# Patient Record
Sex: Male | Born: 1950 | Race: White | Hispanic: No | Marital: Single | Smoking: Former smoker
Health system: Southern US, Community
[De-identification: ages and names within clinical notes are randomized; demographics above are authoritative.]

---

## 2019-03-04 ENCOUNTER — Other Ambulatory Visit: Payer: Self-pay

## 2019-03-04 ENCOUNTER — Inpatient Hospital Stay
Admission: EM | Admit: 2019-03-04 | Discharge: 2019-03-06 | DRG: 246 | Disposition: A | Discharge status: Home / Self Care | Payer: Medicare HMO | Attending: Internal Medicine | Admitting: Internal Medicine

## 2019-03-04 ENCOUNTER — Emergency Department: Payer: Medicare HMO

## 2019-03-04 ENCOUNTER — Encounter
Admission: EM | Disposition: A | Discharge status: Home / Self Care | Payer: Self-pay | Source: Home / Self Care | Attending: Internal Medicine

## 2019-03-04 ENCOUNTER — Encounter: Payer: Self-pay | Admitting: Emergency Medicine

## 2019-03-04 DIAGNOSIS — Z87891 Personal history of nicotine dependence: Secondary | ICD-10-CM

## 2019-03-04 DIAGNOSIS — D72829 Elevated white blood cell count, unspecified: Secondary | ICD-10-CM | POA: Diagnosis present

## 2019-03-04 DIAGNOSIS — R001 Bradycardia, unspecified: Secondary | ICD-10-CM | POA: Diagnosis present

## 2019-03-04 DIAGNOSIS — Z8249 Family history of ischemic heart disease and other diseases of the circulatory system: Secondary | ICD-10-CM | POA: Diagnosis not present

## 2019-03-04 DIAGNOSIS — I213 ST elevation (STEMI) myocardial infarction of unspecified site: Secondary | ICD-10-CM | POA: Diagnosis present

## 2019-03-04 DIAGNOSIS — Z1159 Encounter for screening for other viral diseases: Secondary | ICD-10-CM

## 2019-03-04 DIAGNOSIS — I110 Hypertensive heart disease with heart failure: Secondary | ICD-10-CM | POA: Diagnosis present

## 2019-03-04 DIAGNOSIS — I5021 Acute systolic (congestive) heart failure: Secondary | ICD-10-CM | POA: Diagnosis present

## 2019-03-04 DIAGNOSIS — R079 Chest pain, unspecified: Secondary | ICD-10-CM

## 2019-03-04 DIAGNOSIS — I2119 ST elevation (STEMI) myocardial infarction involving other coronary artery of inferior wall: Secondary | ICD-10-CM | POA: Diagnosis present

## 2019-03-04 DIAGNOSIS — R739 Hyperglycemia, unspecified: Secondary | ICD-10-CM | POA: Diagnosis present

## 2019-03-04 HISTORY — PX: LEFT HEART CATH AND CORONARY ANGIOGRAPHY: CATH118249

## 2019-03-04 HISTORY — PX: CORONARY/GRAFT ACUTE MI REVASCULARIZATION: CATH118305

## 2019-03-04 LAB — CBC
HCT: 50.4 % (ref 39.0–52.0)
Hemoglobin: 16.4 g/dL (ref 13.0–17.0)
MCH: 29.9 pg (ref 26.0–34.0)
MCHC: 32.5 g/dL (ref 30.0–36.0)
MCV: 92 fL (ref 80.0–100.0)
Platelets: 219 10*3/uL (ref 150–400)
RBC: 5.48 MIL/uL (ref 4.22–5.81)
RDW: 13.2 % (ref 11.5–15.5)
WBC: 10.8 10*3/uL — ABNORMAL HIGH (ref 4.0–10.5)
nRBC: 0 % (ref 0.0–0.2)

## 2019-03-04 LAB — BASIC METABOLIC PANEL
Anion gap: 10 (ref 5–15)
BUN: 16 mg/dL (ref 8–23)
CO2: 26 mmol/L (ref 22–32)
Calcium: 8.9 mg/dL (ref 8.9–10.3)
Chloride: 104 mmol/L (ref 98–111)
Creatinine, Ser: 1.11 mg/dL (ref 0.61–1.24)
GFR calc Af Amer: >60 mL/min (ref >60)
GFR calc non Af Amer: >60 mL/min (ref >60)
Glucose, Bld: 132 mg/dL — ABNORMAL HIGH (ref 70–99)
Potassium: 4 mmol/L (ref 3.5–5.1)
Sodium: 140 mmol/L (ref 135–145)

## 2019-03-04 LAB — TROPONIN I
Troponin I: 0.32 ng/mL (ref <0.03)
Troponin I: 6.37 ng/mL (ref <0.03)

## 2019-03-04 LAB — POCT ACTIVATED CLOTTING TIME
Activated Clotting Time: 224 seconds
Activated Clotting Time: 312 seconds

## 2019-03-04 LAB — GLUCOSE, CAPILLARY: Glucose-Capillary: 106 mg/dL — ABNORMAL HIGH (ref 70–99)

## 2019-03-04 LAB — SARS CORONAVIRUS 2 BY RT PCR (HOSPITAL ORDER, PERFORMED IN ~~LOC~~ HOSPITAL LAB): SARS Coronavirus 2: NEGATIVE

## 2019-03-04 SURGERY — CORONARY/GRAFT ACUTE MI REVASCULARIZATION
Anesthesia: Moderate Sedation

## 2019-03-04 MED ORDER — SODIUM CHLORIDE 0.9 % IV SOLN: 250.0000 mL | INTRAVENOUS | Status: DC | PRN

## 2019-03-04 MED ORDER — HEPARIN SODIUM (PORCINE) 1000 UNIT/ML IJ SOLN
INTRAMUSCULAR | Status: AC
  Filled 2019-03-04: qty 1

## 2019-03-04 MED ORDER — SODIUM CHLORIDE 0.9 % WEIGHT BASED INFUSION
1.0000 mL/kg/h | INTRAVENOUS | Status: AC
  Administered 2019-03-04 (×2): 1 mL/kg/h via INTRAVENOUS

## 2019-03-04 MED ORDER — NITROGLYCERIN 1 MG/10 ML FOR IR/CATH LAB
INTRA_ARTERIAL | Status: DC | PRN
  Administered 2019-03-04: 200 ug via INTRACORONARY

## 2019-03-04 MED ORDER — VERAPAMIL HCL 2.5 MG/ML IV SOLN
INTRAVENOUS | Status: DC | PRN
  Administered 2019-03-04: 2.5 mg via INTRAVENOUS

## 2019-03-04 MED ORDER — LISINOPRIL 5 MG PO TABS
5.0000 mg | ORAL_TABLET | Freq: Every day | ORAL | Status: DC
  Administered 2019-03-04 – 2019-03-06 (×3): 5 mg via ORAL
  Filled 2019-03-04 (×3): qty 1

## 2019-03-04 MED ORDER — TICAGRELOR 90 MG PO TABS
90.0000 mg | ORAL_TABLET | Freq: Two times a day (BID) | ORAL | Status: DC
  Administered 2019-03-04 – 2019-03-06 (×4): 90 mg via ORAL
  Filled 2019-03-04 (×4): qty 1

## 2019-03-04 MED ORDER — ASPIRIN 81 MG PO CHEW
81.0000 mg | CHEWABLE_TABLET | Freq: Every day | ORAL | Status: DC
  Administered 2019-03-04 – 2019-03-06 (×3): 81 mg via ORAL
  Filled 2019-03-04 (×3): qty 1

## 2019-03-04 MED ORDER — FENTANYL CITRATE (PF) 100 MCG/2ML IJ SOLN
INTRAMUSCULAR | Status: AC
  Filled 2019-03-04: qty 2

## 2019-03-04 MED ORDER — IOPAMIDOL (ISOVUE-300) INJECTION 61%
INTRAVENOUS | Status: DC | PRN
  Administered 2019-03-04: 14:00:00 220 mL via INTRAVENOUS

## 2019-03-04 MED ORDER — LABETALOL HCL 5 MG/ML IV SOLN: 10.0000 mg | INTRAVENOUS | Status: AC | PRN

## 2019-03-04 MED ORDER — ONDANSETRON HCL 4 MG/2ML IJ SOLN
4.0000 mg | Freq: Four times a day (QID) | INTRAMUSCULAR | Status: DC | PRN
  Administered 2019-03-04: 4 mg via INTRAVENOUS
  Filled 2019-03-04: qty 2

## 2019-03-04 MED ORDER — VERAPAMIL HCL 2.5 MG/ML IV SOLN
INTRAVENOUS | Status: AC
  Filled 2019-03-04: qty 2

## 2019-03-04 MED ORDER — ATORVASTATIN CALCIUM 20 MG PO TABS
80.0000 mg | ORAL_TABLET | Freq: Every day | ORAL | Status: DC
  Administered 2019-03-05: 80 mg via ORAL
  Filled 2019-03-04: qty 4
  Filled 2019-03-04 (×2): qty 1

## 2019-03-04 MED ORDER — MIDAZOLAM HCL 2 MG/2ML IJ SOLN
INTRAMUSCULAR | Status: AC
  Filled 2019-03-04: qty 2

## 2019-03-04 MED ORDER — NITROGLYCERIN 1 MG/10 ML FOR IR/CATH LAB
INTRA_ARTERIAL | Status: AC
  Filled 2019-03-04: qty 10

## 2019-03-04 MED ORDER — ACETAMINOPHEN 325 MG PO TABS: 650.0000 mg | ORAL_TABLET | ORAL | Status: DC | PRN

## 2019-03-04 MED ORDER — SODIUM CHLORIDE 0.9% FLUSH
3.0000 mL | Freq: Two times a day (BID) | INTRAVENOUS | Status: DC
  Administered 2019-03-05 – 2019-03-06 (×4): 3 mL via INTRAVENOUS

## 2019-03-04 MED ORDER — MIDAZOLAM HCL 2 MG/2ML IJ SOLN
INTRAMUSCULAR | Status: DC | PRN
  Administered 2019-03-04: 1 mg via INTRAVENOUS

## 2019-03-04 MED ORDER — HEPARIN SODIUM (PORCINE) 1000 UNIT/ML IJ SOLN
4000.0000 [IU] | Freq: Once | INTRAMUSCULAR | Status: AC
  Administered 2019-03-04: 4000 [IU] via INTRAVENOUS
  Filled 2019-03-04: qty 4

## 2019-03-04 MED ORDER — NITROGLYCERIN 0.4 MG SL SUBL
SUBLINGUAL_TABLET | SUBLINGUAL | Status: AC
  Administered 2019-03-04: 0.4 mg via SUBLINGUAL
  Filled 2019-03-04: qty 1

## 2019-03-04 MED ORDER — HEPARIN SODIUM (PORCINE) 1000 UNIT/ML IJ SOLN
INTRAMUSCULAR | Status: DC | PRN
  Administered 2019-03-04 (×2): 6000 [IU] via INTRAVENOUS

## 2019-03-04 MED ORDER — NITROGLYCERIN 0.4 MG SL SUBL
0.4000 mg | SUBLINGUAL_TABLET | SUBLINGUAL | Status: DC | PRN
  Administered 2019-03-04: 16:00:00 0.4 mg via SUBLINGUAL

## 2019-03-04 MED ORDER — TICAGRELOR 90 MG PO TABS
ORAL_TABLET | ORAL | Status: AC
  Filled 2019-03-04: qty 2

## 2019-03-04 MED ORDER — FENTANYL CITRATE (PF) 100 MCG/2ML IJ SOLN
INTRAMUSCULAR | Status: DC | PRN
  Administered 2019-03-04: 25 ug via INTRAVENOUS

## 2019-03-04 MED ORDER — HYDRALAZINE HCL 20 MG/ML IJ SOLN: 10.0000 mg | INTRAMUSCULAR | Status: AC | PRN

## 2019-03-04 MED ORDER — SODIUM CHLORIDE 0.9% FLUSH: 3.0000 mL | INTRAVENOUS | Status: DC | PRN

## 2019-03-04 MED ORDER — HEPARIN (PORCINE) IN NACL 1000-0.9 UT/500ML-% IV SOLN
INTRAVENOUS | Status: AC
  Filled 2019-03-04: qty 1000

## 2019-03-04 MED ORDER — TICAGRELOR 90 MG PO TABS
ORAL_TABLET | ORAL | Status: DC | PRN
  Administered 2019-03-04: 180 mg via ORAL

## 2019-03-04 MED ORDER — SODIUM CHLORIDE 0.9 % IV SOLN
INTRAVENOUS | Status: AC | PRN
  Administered 2019-03-04: 50 mL/h via INTRAVENOUS

## 2019-03-04 SURGICAL SUPPLY — 14 items
BALLN TREK RX 2.5X20 (BALLOONS) ×3
BALLOON TREK RX 2.5X20 (BALLOONS) ×1 IMPLANT
CATH INFINITI 5 FR JL3.5 (CATHETERS) ×3 IMPLANT
CATH INFINITI 5FR ANG PIGTAIL (CATHETERS) ×3 IMPLANT
CATH LAUNCHER 6FR JR4 (CATHETERS) ×3 IMPLANT
CATH VISTA GUIDE 6FR JL3.5 (CATHETERS) ×3 IMPLANT
DEVICE INFLAT 30 PLUS (MISCELLANEOUS) ×3 IMPLANT
DEVICE RAD TR BAND REGULAR (VASCULAR PRODUCTS) ×3 IMPLANT
GLIDESHEATH SLEND SS 6F .021 (SHEATH) ×3 IMPLANT
KIT MANI 3VAL PERCEP (MISCELLANEOUS) ×3 IMPLANT
PACK CARDIAC CATH (CUSTOM PROCEDURE TRAY) ×3 IMPLANT
STENT SYNERGY DES 3X24 (Permanent Stent) ×3 IMPLANT
WIRE ASAHI PROWATER 180CM (WIRE) ×3 IMPLANT
WIRE ROSEN-J .035X260CM (WIRE) ×3 IMPLANT

## 2019-03-04 NOTE — Progress Notes (Signed)
   03/04/19 1300  Clinical Encounter Type  Visited With Patient  Visit Type Initial;Spiritual support;Other (Comment)  Referral From Nurse  Consult/Referral To Nurse  Spiritual Encounters  Spiritual Needs Emotional  Chaplain received page for Code Stemi and arrived at patient room and patient was lying in bed. Chaplain introduce herself and ask the patient was he feeling better. Patient told Chaplain "they gave me four baby Asprin and my pain was at 9 but now it is at 1". Chaplain said she was glad patient was feeling better. Chaplain ask was there anything she could do for him at this time and patient said no thank you. Chaplain told patient if he needed anything to have her page.

## 2019-03-04 NOTE — ED Provider Notes (Signed)
[facility] Emergency Department Provider Note  Time seen: 12:45 PM  I have reviewed the triage vital signs and the nursing notes.   HISTORY  Chief Complaint Chest Pain   HPI Justin Smith is a 68 y.o. male with no past medical history, takes no medications, presents to the emergency department for chest pain.  According to the patient has had moderate intermittent chest pain in the central chest over the past 2 to 3 days.  States it comes and goes but never last more than a few minutes.  Today several hours ago he states that chest pain started and has been fairly constant now with left arm radiation.  States he became very diaphoretic, denies any shortness of breath or nausea.  No fever cough congestion.  Patient denies any prior cardiac issues.  No prior cardiac catheterizations.   History reviewed. No pertinent past medical history.  There are no active problems to display for this patient.   History reviewed. No pertinent surgical history.  Prior to Admission medications   Not on File    No Known Allergies  History reviewed. No pertinent family history.  Social History Social History   Tobacco Use  . Smoking status: Former Games developermoker  . Smokeless tobacco: Never Used  Substance Use Topics  . Alcohol use: Not Currently  . Drug use: Never    Review of Systems Constitutional: Negative for fever. Cardiovascular: Positive for chest pain with left arm radiation. Respiratory: Negative for shortness of breath.  Negative for cough. Gastrointestinal: Negative for abdominal pain, vomiting.  Negative for nausea. Musculoskeletal: Negative for musculoskeletal complaints Skin: States diaphoresis earlier, none currently. Neurological: Negative for headache All other ROS negative  ____________________________________________   PHYSICAL EXAM:  Constitutional: Alert and oriented. Well appearing and in no distress. Eyes: Normal exam ENT      Head:  Normocephalic and atraumatic      Mouth/Throat: Mucous membranes are moist. Cardiovascular: Normal rate, regular rhythm. Respiratory: Normal respiratory effort without tachypnea nor retractions. Breath sounds are clear  Gastrointestinal: Soft and nontender. No distention. Musculoskeletal: Nontender with normal range of motion in all extremities. No lower extremity tenderness or edema. Neurologic:  Normal speech and language. No gross focal neurologic deficits  Skin:  Skin is warm, dry and intact.  Psychiatric: Mood and affect are normal.   ____________________________________________    EKG  EKG viewed and interpreted by myself shows a normal sinus rhythm at 61 bpm.  Narrow QRS, normal axis.  Normal intervals.  Patient does have mild ST elevation in 2, 3, aVF with mild depression in V2.  Consistent with possible inferior or posterior STEMI.  Repeat EKG 21: 41: 24 viewed and interpreted by myself shows a normal sinus rhythm at 62 bpm with a narrow QRS, normal axis, normal intervals, mild ST elevation in leads II, III and aVF with slight ST depression in V2.  ____________________________________________    RADIOLOGY  Chest x-ray shows cardiomegaly.  ____________________________________________   INITIAL IMPRESSION / ASSESSMENT AND PLAN / ED COURSE  Pertinent labs & imaging results that were available during my care of the patient were reviewed by me and considered in my medical decision making (see chart for details).   Patient presents emergency department for intermittent chest pain over the past 2 to 3 days, now a sudden worsening constant chest pain with left arm radiation and diaphoresis at home.  Differential would include ACS, chest wall pain, gastric pain.  Patient's EKG is most consistent with an inferior  Vs posterior STEMI.  Patient received 324 mg aspirin by EMS.  We will dose 4000 units of heparin.  We will discuss with cardiology for a code STEMI.  Patient was taken  emergently to the cardiac catheterization lab for STEMI.  Labs resulted after the patient left the emergency department showed a troponin of 0.32.  Justin Smith was evaluated in Emergency Department on 03/04/2019 for the symptoms described in the history of present illness. He was evaluated in the context of the global COVID-19 pandemic, which necessitated consideration that the patient might be at risk for infection with the SARS-CoV-2 virus that causes COVID-19. Institutional protocols and algorithms that pertain to the evaluation of patients at risk for COVID-19 are in a state of rapid change based on information released by regulatory bodies including the CDC and federal and state organizations. These policies and algorithms were followed during the patient's care in the ED. CRITICAL CARE Performed by: Minna Antis   Total critical care time: 30 minutes  Critical care time was exclusive of separately billable procedures and treating other patients.  Critical care was necessary to treat or prevent imminent or life-threatening deterioration.  Critical care was time spent personally by me on the following activities: development of treatment plan with patient and/or surrogate as well as nursing, discussions with consultants, evaluation of patient's response to treatment, examination of patient, obtaining history from patient or surrogate, ordering and performing treatments and interventions, ordering and review of laboratory studies, ordering and review of radiographic studies, pulse oximetry and re-evaluation of patient's condition.   ____________________________________________   FINAL CLINICAL IMPRESSION(S) / ED DIAGNOSES  Chest pain STEMI   Minna Antis, MD 03/04/19 1531

## 2019-03-04 NOTE — ED Notes (Signed)
Second EKG read and signed by MD Paduchowski at 1241.

## 2019-03-04 NOTE — ED Triage Notes (Signed)
Pt presents to ED via [facility] from home c/o mid-chest pain/pressure starting 3 days ago and worsening today. Pt states feels like something is sitting on his chest. Denies any medical history, denies allergies. Given 324mg  ASA by EMS PTA.

## 2019-03-04 NOTE — ED Notes (Signed)
Pt placed on pads on arrival for EKG reading acute MI.

## 2019-03-04 NOTE — ED Notes (Signed)
Pt reports complete relief of pain at this time

## 2019-03-04 NOTE — Progress Notes (Signed)
Family Meeting Note  Advance Directive:yes  Today a meeting took place with the Patient.  Patient is able to participate.  The following clinical team members were present during this meeting:MD  The following were discussed:Patient's diagnosis: acute STEMI, Patient's progosis: Unable to determine and Goals for treatment: Full Code  Additional follow-up to be provided: prn  Time spent during discussion:20 minutes  Hilton Sinclair, MD

## 2019-03-04 NOTE — ED Notes (Signed)
Pt girlfriend: Verlee Monte [phone]

## 2019-03-04 NOTE — H&P (Signed)
[employer] [facility] -  at [facility]   PATIENT NAME: Justin SequinDavid Smith    MR#:  [id number]  DATE OF BIRTH:  1951-01-04  DATE OF ADMISSION:  03/04/2019  PRIMARY CARE PHYSICIAN: No primary care provider on file.   REQUESTING/REFERRING PHYSICIAN: Minna AntisKevin Paduchowski, MD  CHIEF COMPLAINT:   Chief Complaint  Patient presents with  . Code STEMI    HISTORY OF PRESENT ILLNESS:  Justin Smith  is a 68 y.o. male with no PMH who presented to the ED with substernal chest pain for the last 3 to 4 days.  The chest pain was worse with exertion.  He also had associated shortness of breath and diaphoresis.  He did not have any nausea.  The chest pain would resolve with rest.  This morning, he woke up to severe chest pain that did not resolve with rest, so he came into the ED.  On arrival to the ED, code STEMI was called.  Patient was taken emergently to the Cath Lab.  He was found to have an inferolateral STEMI.  Stent was placed and patient was admitted to the ICU.  PAST MEDICAL HISTORY:  None  PAST SURGICAL HISTORY:  None  SOCIAL HISTORY:   Social History   Tobacco Use  . Smoking status: Former Games developermoker  . Smokeless tobacco: Never Used  Substance Use Topics  . Alcohol use: Not Currently    FAMILY HISTORY:  Father-heart disease  DRUG ALLERGIES:  No Known Allergies  REVIEW OF SYSTEMS:   Review of Systems  Constitutional: Positive for diaphoresis. Negative for chills and fever.  HENT: Negative for congestion and sore throat.   Eyes: Negative for blurred vision.  Respiratory: Positive for shortness of breath. Negative for cough.   Cardiovascular: Positive for chest pain. Negative for palpitations and leg swelling.  Gastrointestinal: Positive for nausea. Negative for abdominal pain and vomiting.  Genitourinary: Negative for dysuria and urgency.  Musculoskeletal: Negative for back pain and neck pain.  Neurological: Negative for dizziness and headaches.   Psychiatric/Behavioral: Negative for depression. The patient is not nervous/anxious.     MEDICATIONS AT HOME:   Prior to Admission medications   Not on File      VITAL SIGNS:  Blood pressure (!) 156/98, pulse (!) 59, temperature 98.2 F (36.8 C), temperature source Oral, resp. rate 15, height 5\' 10"  (1.778 m), weight 122.5 kg, SpO2 100 %.  PHYSICAL EXAMINATION:  Physical Exam  GENERAL:  68 y.o.-year-old patient lying in the bed with no acute distress.  EYES: Pupils equal, round, reactive to light and accommodation. No scleral icterus. Extraocular muscles intact.  HEENT: Head atraumatic, normocephalic. Oropharynx and nasopharynx clear.  NECK:  Supple, no jugular venous distention. No thyroid enlargement, no tenderness.  LUNGS: Normal breath sounds bilaterally, no wheezing, rales,rhonchi or crepitation. No use of accessory muscles of respiration.  CARDIOVASCULAR: RRR, S1, S2 normal. No murmurs, rubs, or gallops.  ABDOMEN: Soft, nontender, nondistended. Bowel sounds present. No organomegaly or mass.  EXTREMITIES: No pedal edema, cyanosis, or clubbing. +Bandage over cath site NEUROLOGIC: Cranial nerves II through XII are intact. Muscle strength 5/5 in all extremities. Sensation intact. Gait not checked.  PSYCHIATRIC: The patient is alert and oriented x 3.  SKIN: No obvious rash, lesion, or ulcer.   LABORATORY PANEL:   CBC Recent Labs  Lab 03/04/19 1245  WBC 10.8*  HGB 16.4  HCT 50.4  PLT 219   ------------------------------------------------------------------------------------------------------------------  Chemistries  Recent Labs  Lab 03/04/19 1245  NA 140  K 4.0  CL 104  CO2 26  GLUCOSE 132*  BUN 16  CREATININE 1.11  CALCIUM 8.9   ------------------------------------------------------------------------------------------------------------------  Cardiac Enzymes Recent Labs  Lab 03/04/19 1245  TROPONINI 0.32*    ------------------------------------------------------------------------------------------------------------------  RADIOLOGY:  Dg Chest Portable 1 View  Result Date: 03/04/2019 CLINICAL DATA:  Chest pain EXAM: PORTABLE CHEST 1 VIEW COMPARISON:  None. FINDINGS: Cardiomegaly. Both lungs are clear. The visualized skeletal structures are unremarkable. IMPRESSION: Cardiomegaly without acute abnormality of the lungs in AP portable projection. Electronically Signed   By: Lauralyn Primes M.D.   On: 03/04/2019 13:02      IMPRESSION AND PLAN:   Acute inferolateral STEMI- code STEMI called on arrival to the ED. Troponin 0.32 and EKG with ST elevations in the inferior leads. -S/p emergent cath with stent x 1 -Start aspirin, brilinta, lipitor, lisinopril -Check A1c and lipid panel [facility] following  New diagnosis of systolic congestive heart failure- EF 40% noted during cath. -Continue lisinopril -Add beta blocker as able  Leukocytosis- likely due to above. No signs of infection. -Recheck WBC in the morning  Hypertension -Start lisinopril -Hydralazine and labetalol prn  Hyperglycemia- no diagnosis of diabetes -Check A1c  All the records are reviewed and case discussed with ED provider. Management plans discussed with the patient, family and they are in agreement.  CODE STATUS: Full  TOTAL TIME TAKING CARE OF THIS PATIENT: 45 minutes.    Jinny Blossom Mayo M.D on 03/04/2019 at 2:08 PM  Between 7am to 6pm - Pager [phone]  After 6pm go to [email or web address] - Scientist, research (life sciences) [place] [facility]  Office  [phone]  CC: Primary care physician; No primary care provider on file.   Note: This dictation was prepared with Dragon dictation along with smaller phrase technology. Any transcriptional errors that result from this process are unintentional.

## 2019-03-05 ENCOUNTER — Inpatient Hospital Stay
Admit: 2019-03-05 | Discharge: 2019-03-05 | Disposition: A | Discharge status: Home / Self Care | Payer: Medicare HMO | Attending: Cardiology | Admitting: Cardiology

## 2019-03-05 ENCOUNTER — Encounter: Payer: Self-pay | Admitting: Cardiology

## 2019-03-05 LAB — CBC
HCT: 44.7 % (ref 39.0–52.0)
Hemoglobin: 14.8 g/dL (ref 13.0–17.0)
MCH: 30 pg (ref 26.0–34.0)
MCHC: 33.1 g/dL (ref 30.0–36.0)
MCV: 90.5 fL (ref 80.0–100.0)
Platelets: 199 10*3/uL (ref 150–400)
RBC: 4.94 MIL/uL (ref 4.22–5.81)
RDW: 13.3 % (ref 11.5–15.5)
WBC: 13.1 10*3/uL — ABNORMAL HIGH (ref 4.0–10.5)
nRBC: 0 % (ref 0.0–0.2)

## 2019-03-05 LAB — BASIC METABOLIC PANEL
Anion gap: 10 (ref 5–15)
BUN: 14 mg/dL (ref 8–23)
CO2: 24 mmol/L (ref 22–32)
Calcium: 8.4 mg/dL — ABNORMAL LOW (ref 8.9–10.3)
Chloride: 106 mmol/L (ref 98–111)
Creatinine, Ser: 0.97 mg/dL (ref 0.61–1.24)
GFR calc Af Amer: >60 mL/min (ref >60)
GFR calc non Af Amer: >60 mL/min (ref >60)
Glucose, Bld: 112 mg/dL — ABNORMAL HIGH (ref 70–99)
Potassium: 4 mmol/L (ref 3.5–5.1)
Sodium: 140 mmol/L (ref 135–145)

## 2019-03-05 LAB — ECHOCARDIOGRAM COMPLETE
Height: 70 in
Weight: 4370.4 oz

## 2019-03-05 LAB — LIPID PANEL
Cholesterol: 175 mg/dL (ref 0–200)
HDL: 31 mg/dL — ABNORMAL LOW (ref >40)
LDL Cholesterol: 126 mg/dL — ABNORMAL HIGH (ref 0–99)
Total CHOL/HDL Ratio: 5.6 RATIO
Triglycerides: 90 mg/dL (ref <150)
VLDL: 18 mg/dL (ref 0–40)

## 2019-03-05 LAB — HEMOGLOBIN A1C
Hgb A1c MFr Bld: 6 % — ABNORMAL HIGH (ref 4.8–5.6)
Mean Plasma Glucose: 125.5 mg/dL

## 2019-03-05 LAB — TROPONIN I
Troponin I: 16.14 ng/mL (ref <0.03)
Troponin I: 35.02 ng/mL (ref <0.03)

## 2019-03-05 NOTE — Progress Notes (Addendum)
Cardiac Rehab Navigator/ Exercise Physiologist Note  "Heart Attack Bouncing Back" booklet given and reviewed with patient. This EP discussed the definition of CAD. This EP reviewed the location of CAD and where his stent was placed. Informed patient he  will be given a stent card. Explained the purpose of the stent card. Instructed patient to keep stent card in his wallet.  ? This EP discussed modifiable risk factors including controlling blood pressure, cholesterol, and blood sugar; following heart healthy diet; maintaining healthy weight; exercise; and smoking cessation, not applicable. Patient will buy a blood pressure cuff so he can check it at home. ? This EP discussed cardiac medications including rationale for taking, mechanisms of action, and side effects. Stressed the importance of taking medications as prescribed.  ? This EP discussed emergency plan for heart attack symptoms. Patient verbalized understanding of need to call 911 and not to drive himself to ER if having cardiac symptoms / chest pain.   Diet of low sodium, low fat, low cholesterol heart healthy diet discussed. Information on diet provided. This EP discussed with patient that he should make small changes as he can instead of changing everything all at once and that he would be more likely to stick to that type of change.  Stress- Patient has high stress. Patient cares for girlfriend who had stroke which occupies a lot of his time and also works. Patient reports no depression. Patient likes to garden and work on his truck.  Smoking Cessation - Patient is a FORMER smoker.  Exercise - Benefits of exercised discussed. Patient likes to swim laps at the [facility] and walk outside. Patient reports that he has not been able to do much at all in the past month because of low energy. This EP encouraged patient that [facility] but would need to start slowly with walking first. Informed patient his cardiologist  has referred him to outpatient Cardiac Rehab. An overview of the program was provided. Brochure, informational letter, class and orientation times, and CPT billing codes given to patient. Patient is interested in participating. Patient plans to check with his insurance company to see what his out-of-pocket expenses will be.   Patient informed the Cardiac Rehab department is currently closed due to the COVID-19 pandemic.  The Cardiac Rehab dept will contact patient as soon as the department reopens. Patient is interested in [employer] as well.  Patient appreciative of the information.    Nelson Chimes, BS [place]  [facility]iac [facility] Pulmonary Re[facility]  Exercise Physiologist Department Phone #: [phone] Fax: [phone]  Direct Line [phone] Email Address: [email or web address]

## 2019-03-05 NOTE — TOC Transition Note (Signed)
Transition of Care [facility]) - CM/SW Discharge Note   Patient Details  Name: Cali Golson MRN: [id number] Date of Birth: [birth year]  [facility]) CM/SW Contact:  Eber Hong, RN Phone Number: 03/05/2019, 2:46 PM   Clinical Narrative:     Low risk score but at time of chart review- there was no insurance listed.  Patient does have medicare humana and says he also has medicaid and medicare part D. He does not have any of his insurance cards with him. Admitted with stemi.  To discharge home on Brilinta.  Will need 30 day trial coupons. Requested patient to have his girlfriend to bring insurance cards.  He is followed by [facility] on [address] and uses their pharmacy.  CM discussed that if discharges over the weekend, that pharmacy would not be open. He asked if hospital could provide him with "a few pills." CM informed him his script could be taken to to a drug store.  He agrees and says his girlfriend will decide which one "because I do not know about all that."He does not recall the name of his pcp.         Patient Goals and CMS Choice        Discharge Placement                       Discharge Plan and Services                                     Social Determinants of Health (SDOH) Interventions     Readmission Risk Interventions No flowsheet data found.

## 2019-03-05 NOTE — Progress Notes (Signed)
[facility] - [place] at [facility]   PATIENT NAME: Justin Smith    MR#:  [id number]  DATE OF BIRTH:  18-Mar-1951  SUBJECTIVE:  Patient without CP overnight  REVIEW OF SYSTEMS:    Review of Systems  Constitutional: Negative for fever, chills weight loss HENT: Negative for ear pain, nosebleeds, congestion, facial swelling, rhinorrhea, neck pain, neck stiffness and ear discharge.   Respiratory: Negative for cough, shortness of breath, wheezing  Cardiovascular: Negative for chest pain, palpitations and leg swelling.  Gastrointestinal: Negative for heartburn, abdominal pain, vomiting, diarrhea or consitpation Genitourinary: Negative for dysuria, urgency, frequency, hematuria Musculoskeletal: Negative for back pain or joint pain Neurological: Negative for dizziness, seizures, syncope, focal weakness,  numbness and headaches.  Hematological: Does not bruise/bleed easily.  Psychiatric/Behavioral: Negative for hallucinations, confusion, dysphoric mood    Tolerating Diet:yes      DRUG ALLERGIES:  No Known Allergies  VITALS:  Blood pressure 100/71, pulse 60, temperature 98.6 F (37 C), resp. rate 14, height 5\' 10"  (1.778 m), weight 123.9 kg, SpO2 99 %.  PHYSICAL EXAMINATION:  Constitutional: Appears well-developed and well-nourished. No distress. HENT: Normocephalic. Marland Kitchen. Oropharynx is clear and moist.  Eyes: Conjunctivae and EOM are normal. PERRLA, no scleral icterus.  Neck: Normal ROM. Neck supple. No JVD. No tracheal deviation. CVS: RRR, S1/S2 +, no murmurs, no gallops, no carotid bruit.  Pulmonary: Effort and breath sounds normal, no stridor, rhonchi, wheezes, rales.  Abdominal: Soft. BS +,  no distension, tenderness, rebound or guarding.  Musculoskeletal: Normal range of motion. No edema and no tenderness.  Neuro: Alert. CN 2-12 grossly intact. No focal deficits. Skin: Skin is warm and dry. No rash noted. Psychiatric: Normal mood and affect.       LABORATORY PANEL:   CBC Recent Labs  Lab 03/05/19 0634  WBC 13.1*  HGB 14.8  HCT 44.7  PLT 199   ------------------------------------------------------------------------------------------------------------------  Chemistries  Recent Labs  Lab 03/05/19 0634  NA 140  K 4.0  CL 106  CO2 24  GLUCOSE 112*  BUN 14  CREATININE 0.97  CALCIUM 8.4*   ------------------------------------------------------------------------------------------------------------------  Cardiac Enzymes Recent Labs  Lab 03/04/19 1836 03/05/19 0057 03/05/19 0634  TROPONINI 6.37* 16.14* 35.02*   ------------------------------------------------------------------------------------------------------------------  RADIOLOGY:  Dg Chest Portable 1 View  Result Date: 03/04/2019 CLINICAL DATA:  Chest pain EXAM: PORTABLE CHEST 1 VIEW COMPARISON:  None. FINDINGS: Cardiomegaly. Both lungs are clear. The visualized skeletal structures are unremarkable. IMPRESSION: Cardiomegaly without acute abnormality of the lungs in AP portable projection. Electronically Signed   By: Lauralyn PrimesAlex  Bibbey M.D.   On: 03/04/2019 13:02     ASSESSMENT AND PLAN:   68 year old male with no past medical history presented to the emergency room due to substernal chest pain.  1.  Inferolateral STEMI: Patient is status post PCI with drug-eluting stent to OM1 Ejection fraction is noted to be 40% on cath. Echocardiogram pending. Continue aspirin, atorvastatin, lisinopril and Brilinta. Heart rate too low to add beta-blocker LDL 126 Outpatient cardiac rehab at discharge  2.  Acute systolic heart failure ejection fraction 40% on cardiac catheterization: Follow-up on echocardiogram Continue ACE inhibitor Beta-blocker if heart rate tolerates  3.  Hyperglycemia: A1c pending     Management plans discussed with the patient and he is in agreement.  CODE STATUS: Full  TOTAL TIME TAKING CARE OF THIS PATIENT: 30 minutes.      POSSIBLE D/C tomorrow, DEPENDING ON CLINICAL CONDITION.   Adrian SaranSital Decorian Schuenemann M.D on 03/05/2019 at 11:02 AM  Between 7am to 6pm - Pager - [phone] After 6pm go to [email or web address] - Social research officer, government  [facility] [place] [facility]  Office  [phone]  CC: Primary care physician; No primary care provider on file.  Note: This dictation was prepared with Dragon dictation along with smaller phrase technology. Any transcriptional errors that result from this process are unintentional.

## 2019-03-05 NOTE — Progress Notes (Signed)
*  PRELIMINARY RESULTS* Echocardiogram 2D Echocardiogram has been performed.  Cristela Blue 03/05/2019, 12:08 PM

## 2019-03-05 NOTE — Consult Note (Signed)
[facility]  CARDIOLOGY CONSULT NOTE  Patient ID: Justin SequinDavid Smith MRN: [id number] DOB/AGE: 03-04-1951 68 y.o.  Admit date: 03/04/2019 Referring Physician [facility] Primary Physician  Primary Cardiologist France Noyce Reason for Consultation inferolateral STEMI  HPI: [id number] old gentleman who [facility] room with 2 to 3-day history of intermittent chest pain.  In the emergency room, the patient complained of 8/10 chest pain.  ECG revealed ST elevations in inferior lateral leads.  The patient was taken emergently to cardiac catheterization where coronary angiography revealed 99% stenosis ostium of a large obtuse marginal branch.  The patient underwent primary PCI with DES OM1.  He was returned to the ICU where he had an uncomplicated hospital course.  Then for MI with a troponin of 0.32, 0.37, 16.14, and 35.02.  On the morning of/29/2020, patient denied chest pain  Review of systems complete and found to be negative unless listed above     History reviewed. No pertinent past medical history.  Past Surgical History:  Procedure Laterality Date  . CORONARY/GRAFT ACUTE MI REVASCULARIZATION N/A 03/04/2019   Procedure: Coronary/Graft Acute MI Revascularization;  Surgeon: Marcina MillardParaschos, Shanyia Stines, MD;  Location: [facility] INVASIVE CV LAB;  Service: Cardiovascular;  Laterality: N/A;  . LEFT HEART CATH AND CORONARY ANGIOGRAPHY N/A 03/04/2019   Procedure: LEFT HEART CATH AND CORONARY ANGIOGRAPHY;  Surgeon: Marcina MillardParaschos, Ruffin Lada, MD;  Location: [facility] INVASIVE CV LAB;  Service: Cardiovascular;  Laterality: N/A;    Medications Prior to Admission  Medication Sig Dispense Refill Last Dose  . Multiple Vitamin (MULTIVITAMIN WITH MINERALS) TABS tablet Take 1 tablet by mouth daily.      Social History   Socioeconomic History  . Marital status: Not on file    Spouse name: Not on file  . Number of children: Not on file  . Years of education: Not on file  . Highest education level: Not on file  Occupational  History  . Not on file  Social Needs  . Financial resource strain: Not on file  . Food insecurity:    Worry: Not on file    Inability: Not on file  . Transportation needs:    Medical: Not on file    Non-medical: Not on file  Tobacco Use  . Smoking status: Former Games developermoker  . Smokeless tobacco: Never Used  Substance and Sexual Activity  . Alcohol use: Not Currently  . Drug use: Never  . Sexual activity: Not on file  Lifestyle  . Physical activity:    Days per week: Not on file    Minutes per session: Not on file  . Stress: Not on file  Relationships  . Social connections:    Talks on phone: Not on file    Gets together: Not on file    Attends religious service: Not on file    Active member of club or organization: Not on file    Attends meetings of clubs or organizations: Not on file    Relationship status: Not on file  . Intimate partner violence:    Fear of current or ex partner: Not on file    Emotionally abused: Not on file    Physically abused: Not on file    Forced sexual activity: Not on file  Other Topics Concern  . Not on file  Social History Narrative  . Not on file    History reviewed. No pertinent family history.    Review of systems complete and found to be negative unless listed above      PHYSICAL  EXAM  General: Well developed, well nourished, in no acute distress HEENT:  Normocephalic and atramatic Neck:  No JVD.  Lungs: Clear bilaterally to auscultation and percussion. Heart: HRRR . Normal S1 and S2 without gallops or murmurs.  Abdomen: Bowel sounds are positive, abdomen soft and non-tender  Msk:  Back normal, normal gait. Normal strength and tone for age. Extremities: No clubbing, cyanosis or edema.   Neuro: Alert and oriented X 3. Psych:  Good affect, responds appropriately  Labs:   Lab Results  Component Value Date   WBC 13.1 (H) 03/05/2019   HGB 14.8 03/05/2019   HCT 44.7 03/05/2019   MCV 90.5 03/05/2019   PLT 199 03/05/2019     Recent Labs  Lab 03/05/19 0634  NA 140  K 4.0  CL 106  CO2 24  BUN 14  CREATININE 0.97  CALCIUM 8.4*  GLUCOSE 112*   Lab Results  Component Value Date   TROPONINI 35.02 (HH) 03/05/2019    Lab Results  Component Value Date   CHOL 175 03/05/2019   Lab Results  Component Value Date   HDL 31 (L) 03/05/2019   Lab Results  Component Value Date   LDLCALC 126 (H) 03/05/2019   Lab Results  Component Value Date   TRIG 90 03/05/2019   Lab Results  Component Value Date   CHOLHDL 5.6 03/05/2019   No results found for: LDLDIRECT    Radiology: Dg Chest Portable 1 View  Result Date: 03/04/2019 CLINICAL DATA:  Chest pain EXAM: PORTABLE CHEST 1 VIEW COMPARISON:  None. FINDINGS: Cardiomegaly. Both lungs are clear. The visualized skeletal structures are unremarkable. IMPRESSION: Cardiomegaly without acute abnormality of the lungs in AP portable projection. Electronically Signed   By: Lauralyn Primes M.D.   On: 03/04/2019 13:02    EKG: Sinus rhythm with inferolateral STT wave abnormalits  ASSESSMENT AND PLAN:    1.  Inferolateral ST elevation myocardial infarction, status post primary PCI with DES OM1 2.  Baseline sinus bradycardia  Recommendations  1.  Agree with current therapy 2.  Continue dual antiplatelet therapy uninterrupted for 1 year 3.  Continue lisinopril 4.  Hold beta-blocker for now with baseline bradycardia 5.  Review 2D echocardiogram 6.  Transfer to to a telemetry 7.  Plan for discharge 03/05/2019 in a.m. 8.  Follow up as outpatient in 1 week  Signed: Marcina Millard MD,PhD, [facility] 03/05/2019, 8:08 AM

## 2019-03-06 MED ORDER — TICAGRELOR 90 MG PO TABS: 90.0000 mg | ORAL_TABLET | Freq: Two times a day (BID) | ORAL | 0 refills | Status: AC

## 2019-03-06 MED ORDER — ASPIRIN 81 MG PO CHEW: 81.0000 mg | CHEWABLE_TABLET | Freq: Every day | ORAL | 0 refills | Status: AC

## 2019-03-06 MED ORDER — LISINOPRIL 5 MG PO TABS: 5.0000 mg | ORAL_TABLET | Freq: Every day | ORAL | 0 refills | Status: AC

## 2019-03-06 MED ORDER — ATORVASTATIN CALCIUM 80 MG PO TABS: 80.0000 mg | ORAL_TABLET | Freq: Every day | ORAL | 0 refills | Status: AC

## 2019-03-06 MED ORDER — CARVEDILOL 3.125 MG PO TABS: 3.1250 mg | ORAL_TABLET | Freq: Two times a day (BID) | ORAL | 0 refills | Status: AC

## 2019-03-06 MED ORDER — CARVEDILOL 3.125 MG PO TABS: 3.1250 mg | ORAL_TABLET | Freq: Two times a day (BID) | ORAL | Status: DC

## 2019-03-06 MED ORDER — NITROGLYCERIN 0.4 MG SL SUBL: 0.4000 mg | SUBLINGUAL_TABLET | SUBLINGUAL | 12 refills | Status: AC | PRN

## 2019-03-06 NOTE — Discharge Instructions (Signed)
Chest Wall Pain Chest wall pain is pain in or around the bones and muscles of your chest. Chest wall pain may be caused by:  An injury.  Coughing a lot.  Using your chest and arm muscles too much. Sometimes, the cause may not be known. This pain may take a few weeks or longer to get better. Follow these instructions at home: Managing pain, stiffness, and swelling If told, put ice on the painful area:  Put ice in a plastic bag.  Place a towel between your skin and the bag.  Leave the ice on for 20 minutes, 2-3 times a day.  Activity  Rest as told by your doctor.  Avoid doing things that cause pain. This includes lifting heavy items.  Ask your doctor what activities are safe for you. General instructions   Take over-the-counter and prescription medicines only as told by your doctor.  Do not use any products that contain nicotine or tobacco, such as cigarettes, e-cigarettes, and chewing tobacco. If you need help quitting, ask your doctor.  Keep all follow-up visits as told by your doctor. This is important. Contact a doctor if:  You have a fever.  Your chest pain gets worse.  You have new symptoms. Get help right away if:  You feel sick to your stomach (nauseous) or you throw up (vomit).  You feel sweaty or light-headed.  You have a cough with mucus from your lungs (sputum) or you cough up blood.  You are short of breath. These symptoms may be an emergency. Do not wait to see if the symptoms will go away. Get medical help right away. Call your local emergency services (911 in the [place] Do not drive yourself to the hospital. Summary  Chest wall pain is pain in or around the bones and muscles of your chest.  It may be treated with ice, rest, and medicines. Your condition may also get better if you avoid doing things that cause pain.  Contact a doctor if you have a fever, chest pain that gets worse, or new symptoms.  Get help right away if you feel light-headed  or you get short of breath. These symptoms may be an emergency. This information is not intended to replace advice given to you by your health care provider. Make sure you discuss any questions you have with your health care provider. Document Released: 03/11/2008 Document Revised: 03/26/2018 Document Reviewed: 03/26/2018 Elsevier Interactive Patient Education  2019 [employer]   Acute Coronary Syndrome  Acute coronary syndrome (ACS) is a serious problem in which there is suddenly not enough blood and oxygen reaching the heart. ACS can result in chest pain or a heart attack. This condition is a medical emergency. If you have any symptoms of this condition, get help right away. What are the causes? This condition may be caused by:  Buildup of fat and cholesterol inside of the arteries (atherosclerosis). This is the most common cause. The buildup (plaque) can cause blood vessels in the heart (coronary arteries) to become narrow or blocked, which reduces blood flow to the heart. Plaque can also break off and lead to a clot, which can block an artery and cause a heart attack or stroke.  Sudden tightening of the muscles around the coronary arteries (coronary spasm).  Tearing of a coronary artery (spontaneous coronary artery dissection).  Very low blood pressure (hypotension).  An abnormal heartbeat (arrhythmia).  Other medical conditions that cause a decrease of oxygen to the heart, such as anemiaorrespiratory failure.  Using cocaine or methamphetamine. What increases the risk? The following factors may make you more likely to develop this condition:  Age. The risk for ACS increases as you get older.  History of chest pain, heart attack, peripheral artery disease, or stroke.  Having taken chemotherapy or immune-suppressing medicines.  Being male.  Family history of chest pain, heart disease, or stroke.  Smoking.  Not exercising enough.  Being overweight.  High  cholesterol.  High blood pressure (hypertension).  Diabetes.  Excessive alcohol use. What are the signs or symptoms? Common symptoms of this condition include:  Chest pain. The pain may last a long time, or it may stop and come back (recur). It may feel like: ? Crushing or squeezing. ? Tightness, pressure, fullness, or heaviness.  Arm, neck, jaw, or back pain.  Heartburn or indigestion.  Shortness of breath.  Nausea.  Sudden cold sweats.  Light-headedness.  Dizziness, or passing out.  Tiredness (fatigue). Sometimes there are no symptoms. How is this diagnosed? This condition may be diagnosed based on:  Your medical history and symptoms.  An electrocardiogram (ECG). This imaging test measures the heart's electrical activity.  Blood tests. Cardiac blood tests may need to be repeated at designated time intervals.  Chest X-ray.  A CT scan of the chest.  A coronary angiogram. This is a procedure in which dye is injected into the bloodstream and then X-rays are taken to show if there is a blockage in a coronary artery.  Exercise stress testing.  Echocardiography. This is a test that uses sound waves to produce detailed images of the heart. How is this treated? The treatment is to restore blood flow to the heart as soon as possible. Treatment for this condition may include:  Oxygen therapy.  Medicines, such as: ? Antiplatelet medicines and blood-thinning medicines, such as aspirin. These help prevent blood clots. ? Medicine that dissolves any blood clots (fibrinolytic therapy). ? Blood pressure medicines. ? Nitroglycerin. This helps relieve chest pain and widens blood vessels to improve blood flow. ? Pain medicine. ? Cholesterol-lowering medicine.  Surgery, such as: ? Coronary angioplasty with stent placement. This involves placing a small piece of metal that looks like mesh or a spring into a narrow coronary artery. This widens the artery and keep it  open. ? Coronary artery bypass surgery. This involves taking a section of a blood vessel from a different part of your body, and placing it on the blocked coronary artery to allow blood to flow around (bypass) the blockage.  Cardiac rehabilitation. This is a program that helps improve your health and well-being. It includes exercise training, education, and counseling to help you recover. Follow these instructions at home: Eating and drinking  Eat a heart-healthy diet that includes whole grains, fruits and vegetables, lean proteins, and low-fat or nonfat dairy products.  Limit how much salt (sodium) you eat as told by your health care provider. Follow instructions from your health care provider about any other eating or drinking restrictions, such as limiting foods that are high in fat and processed sugars.  Use healthy cooking methods such as roasting, grilling, broiling, baking, poaching, steaming, or stir-frying.  Talk with a dietitian to learn about healthy cooking methods and how to eat less sodium. Medicines  Take over-the-counter and prescription medicines only as told by your health care provider.  Do not take these medicines unless your health care provider approves: ? Vitamin supplements that contain vitamin A or vitamin E. ? Nonsteroidal anti-inflammatory drugs (NSAIDs), such as  ibuprofen, naproxen, or celecoxib. ? Hormone replacement therapy that contains estrogen. If you are taking blood thinners:  Talk with your health care provider before you take any medicines that contain aspirin or NSAIDs. These medicines increase your risk for dangerous bleeding.  Take your medicine exactly as told, at the same time every day.  Avoid activities that could cause injury or bruising, and follow instructions about how to prevent falls.  Wear a medical alert bracelet, and carry a card that lists what medicines you take. Activity  Join a cardiac rehabilitation program. An exercise plan  will be developed for you.  Ask your health care provider: ? What activities and exercises are safe for you. ? If you should follow specific instructions about lifting, driving, or climbing stairs. Lifestyle  Do not use any products that contain nicotine or tobacco, such as cigarettes and e-cigarettes. If you need help quitting, ask your health care provider.  If your health care provider says that alcohol is safe for you, limit your alcohol intake to no more than 1 drink a day. One drink equals 12 oz of beer, 5 oz of wine, or 1 oz of hard liquor.  Maintain a healthy weight. If you need to lose weight, work with your health care provider to do so safely. General instructions  Tell all the health care providers who care for you about your heart condition, including your dentist. This may affect the medicines or treatment you receive.  Manage any other health conditions you have, such as hypertension or diabetes. These conditions affect your heart.  Learn ways to manage stress.  Get screened for depression, and get mental health treatment if you need it. People with ACS are at higher risk for depression.  Keep your vaccinations up to date. Get the flu shot (influenza vaccine) every year.  If directed, monitor your blood pressure at home.  Keep all follow-up visits as told by your health care provider. This is important. Contact a health care provider if:  You feel overwhelmed or sad.  You have trouble doing your daily activities. Get help right away if:  You have pain in your chest, neck, arm, jaw, stomach, or back that recurs, and: ? It lasts for more than a few minutes. ? It is not relieved by taking the medicineyour health care provider prescribed.  You have unexplained: ? Heavy sweating. ? Heartburn or indigestion. ? Nausea or vomiting. ? Shortness of breath. ? Difficulty breathing. ? Fatigue. ? Nervousness or anxiety. ? Weakness. ? Diarrhea. ? Dark stools or blood  in your stool.  You have sudden light-headedness or dizziness.  Your blood pressure is higher than 180/120.  You faint.  You have thoughts about hurting yourself. These symptoms may represent a serious problem that is an emergency. Do not wait to see if the symptoms will go away. Get medical help right away. Call your local emergency services (911 in the [place] Do not drive yourself to the hospital. If you ever feel like you may hurt yourself or others, or have thoughts about taking your own life, get help right away. You can go to your nearest emergency department or call:  Emergency services (911 in the [place]  A suicide crisis helpline, such as the National Suicide Prevention Lifeline at 718-886-0726. This is open 24 hours a day. Summary  Acute coronary syndrome (ACS) is when there is not enough blood and oxygen being supplied to the heart. ACS can result in chest pain or a heart attack.  Acute coronary syndrome is a medical emergency. If you have any symptoms of this condition, get help right away.  Treatment includes medicines and procedures to open the blocked arteries and restore blood flow. This information is not intended to replace advice given to you by your health care provider. Make sure you discuss any questions you have with your health care provider. Document Released: 09/23/2005 Document Revised: 06/03/2017 Document Reviewed: 06/03/2017 Elsevier Interactive Patient Education  2019 [employer].

## 2019-03-06 NOTE — Discharge Summary (Signed)
[employer] [facility] - [place] at [facility]   PATIENT NAME: Justin Smith    MR#:  [id number]  DATE OF BIRTH:  1951-09-22  DATE OF ADMISSION:  03/04/2019 ADMITTING PHYSICIAN: Marcina Millard, MD  DATE OF DISCHARGE: 03/06/2019  1:29 PM  PRIMARY CARE PHYSICIAN: No primary care provider on file.   ADMISSION DIAGNOSIS:  STEMI involving oth coronary artery of inferior wall (HCC) [I21.19]  DISCHARGE DIAGNOSIS:  Active Problems:   STEMI involving oth coronary artery of inferior wall (HCC)   SECONDARY DIAGNOSIS:  History reviewed. No pertinent past medical history.   ADMITTING HISTORY Justin Smith  is a 68 y.o. male with no PMH who presented to the ED with substernal chest pain for the last 3 to 4 days.  The chest pain was worse with exertion.  He also had associated shortness of breath and diaphoresis.  He did not have any nausea.  The chest pain would resolve with rest.  This morning, he woke up to severe chest pain that did not resolve with rest, so he came into the ED.  On arrival to the ED, code STEMI was called.  Patient was taken emergently to the Cath Lab.  He was found to have an inferolateral STEMI.  Stent was placed and patient was admitted to the ICU  HOSPITAL COURSE:  Was admitted to ICU for STEMI.  Patient had emergent cardiac cath done and stent placed.  Patient was placed on aspirin, Brilinta high intensity statin and ACE inhibitor.  His chest pain has improved.  Was transitioned to telemetry.  Patient had tachycardia and was restarted on beta-blocker.  He will follow-up with cardiology in the clinic.  Discussed with cardiology prior to discharge.  CONSULTS OBTAINED:  Treatment Team:  Marcina Millard, MD Dalia Heading, MD  DRUG ALLERGIES:  No Known Allergies  DISCHARGE MEDICATIONS:   Allergies as of 03/06/2019   No Known Allergies     Medication List    TAKE these medications   aspirin 81 MG chewable tablet Chew 1 tablet (81 mg total) by mouth  daily for 30 days. Start taking on:  Mar 07, 2019   atorvastatin 80 MG tablet Commonly known as:  LIPITOR Take 1 tablet (80 mg total) by mouth daily at 6 PM for 30 days.   carvedilol 3.125 MG tablet Commonly known as:  COREG Take 1 tablet (3.125 mg total) by mouth 2 (two) times daily with a meal for 30 days.   lisinopril 5 MG tablet Commonly known as:  ZESTRIL Take 1 tablet (5 mg total) by mouth daily for 30 days. Start taking on:  Mar 07, 2019   multivitamin with minerals Tabs tablet Take 1 tablet by mouth daily.   nitroGLYCERIN 0.4 MG SL tablet Commonly known as:  NITROSTAT Place 1 tablet (0.4 mg total) under the tongue every 5 (five) minutes as needed for chest pain.   ticagrelor 90 MG Tabs tablet Commonly known as:  BRILINTA Take 1 tablet (90 mg total) by mouth 2 (two) times daily for 30 days.       Today  Patient seen today No chest pain No shortness of breath Hemodynamically stable  VITAL SIGNS:  Blood pressure 114/76, pulse 91, temperature 98.3 F (36.8 C), temperature source Oral, resp. rate 20, height  (1.778 m), weight 120.7 kg, SpO2 98 %.  I/O:    Intake/Output Summary (Last 24 hours) at 03/06/2019 1343 Last data filed at 03/06/2019 0746 Gross per 24 hour  Intake 240 ml  Output 1000 ml  Net -760 ml    PHYSICAL EXAMINATION:  Physical Exam  GENERAL:  68 y.o.-year-old patient lying in the bed with no acute distress.  LUNGS: Normal breath sounds bilaterally, no wheezing, rales,rhonchi or crepitation. No use of accessory muscles of respiration.  CARDIOVASCULAR: S1, S2 normal. No murmurs, rubs, or gallops.  ABDOMEN: Soft, non-tender, non-distended. Bowel sounds present. No organomegaly or mass.  NEUROLOGIC: Moves all 4 extremities. PSYCHIATRIC: The patient is alert and oriented x 3.  SKIN: No obvious rash, lesion, or ulcer.   DATA REVIEW:   CBC Recent Labs  Lab 03/05/19 0634  WBC 13.1*  HGB 14.8  HCT 44.7  PLT 199    Chemistries   Recent Labs  Lab 03/05/19 0634  NA 140  K 4.0  CL 106  CO2 24  GLUCOSE 112*  BUN 14  CREATININE 0.97  CALCIUM 8.4*    Cardiac Enzymes Recent Labs  Lab 03/05/19 0634  TROPONINI 35.02*    Microbiology Results  Results for orders placed or performed during the hospital encounter of 03/04/19  SARS Coronavirus 2 (CEPHEID - Performed in [facility] hospital lab), Hosp Order     Status: None   Collection Time: 03/04/19 12:50 PM  Result Value Ref Range Status   SARS Coronavirus 2 NEGATIVE NEGATIVE Final    Comment: (NOTE) If result is NEGATIVE SARS-CoV-2 target nucleic acids are NOT DETECTED. The SARS-CoV-2 RNA is generally detectable in upper and lower  respiratory specimens during the acute phase of infection. The lowest  concentration of SARS-CoV-2 viral copies this assay can detect is 250  copies / mL. A negative result does not preclude SARS-CoV-2 infection  and should not be used as the sole basis for treatment or other  patient management decisions.  A negative result may occur with  improper specimen collection / handling, submission of specimen other  than nasopharyngeal swab, presence of viral mutation(s) within the  areas targeted by this assay, and inadequate number of viral copies  (<250 copies / mL). A negative result must be combined with clinical  observations, patient history, and epidemiological information. If result is POSITIVE SARS-CoV-2 target nucleic acids are DETECTED. The SARS-CoV-2 RNA is generally detectable in upper and lower  respiratory specimens dur ing the acute phase of infection.  Positive  results are indicative of active infection with SARS-CoV-2.  Clinical  correlation with patient history and other diagnostic information is  necessary to determine patient infection status.  Positive results do  not rule out bacterial infection or co-infection with other viruses. If result is PRESUMPTIVE POSTIVE SARS-CoV-2 nucleic acids MAY BE PRESENT.    A presumptive positive result was obtained on the submitted specimen  and confirmed on repeat testing.  While 2019 novel coronavirus  (SARS-CoV-2) nucleic acids may be present in the submitted sample  additional confirmatory testing may be necessary for epidemiological  and / or clinical management purposes  to differentiate between  SARS-CoV-2 and other Sarbecovirus currently known to infect humans.  If clinically indicated additional testing with an alternate test  methodology [phone]) is advised. The SARS-CoV-2 RNA is generally  detectable in upper and lower respiratory sp ecimens during the acute  phase of infection. The expected result is Negative. Fact Sheet for Patients:  BoilerBrush.com.cy Fact Sheet for Healthcare Providers: https://pope.com/ This test is not yet approved or cleared by the [place] FDA and has been authorized for detection and/or diagnosis of SARS-CoV-2 by FDA under an Emergency Use Authorization (EUA).  This EUA will remain in effect (meaning this test can be used) for the duration of the COVID-19 declaration under Section 564(b)(1) of the Act, 21 U.S.C. section 360bbb-3(b)(1), unless the authorization is terminated or revoked sooner. Performed at [facility], [address] [place], [place] [address]     RADIOLOGY:  No results found.  Follow up with PCP in 1 week.  Management plans discussed with the patient, family and they are in agreement.  CODE STATUS: Full code    Code Status Orders  (From admission, onward)         Start     Ordered   03/04/19 1448  Full code  Continuous     03/04/19 1447        Code Status History    This patient has a current code status but no historical code status.     TOTAL TIME TAKING CARE OF THIS PATIENT ON DAY OF DISCHARGE: more than 38 minutes.   Ihor AustinPavan  M.D on 03/06/2019 at 1:43 PM  Between 7am to 6pm - Pager -  [phone]  After 6pm go to [email or web address] - password [email or web address] [facility]  [facility] [place] [facility]  Office  [phone]  CC: Primary care physician; No primary care provider on file.  Note: This dictation was prepared with Dragon dictation along with smaller phrase technology. Any transcriptional errors that result from this process are unintentional.

## 2019-03-06 NOTE — Progress Notes (Signed)
Advanced care plan. Purpose of the Encounter: CODE STATUS Parties in Attendance: Patient Patient's Decision Capacity: Good Subjective/Patient's story: Justin Smith  is a 68 y.o. male with no PMH who presented to the ED with substernal chest pain for the last 3 to 4 days.  The chest pain was worse with exertion.  He also had associated shortness of breath and diaphoresis.  He did not have any nausea.  The chest pain would resolve with rest.  This morning, he woke up to severe chest pain that did not resolve with rest, so he came into the ED.  On arrival to the ED, code STEMI was called.  Patient was taken emergently to the Cath Lab.  He was found to have an inferolateral STEMI.  Stent was placed and patient was admitted to the ICU. Objective/Medical story Patient has STEMI needs emergent cardiac cath and stent placement Goals of care determination:  Advance care directives and goals of care discussed. Patient wants everything done which includes cpr, intubation and ventilator if need arises CODE STATUS: Full code Time spent discussing advanced care planning: 16 minutes

## 2019-03-06 NOTE — Progress Notes (Signed)
Patient Name: Justin Smith Date of Encounter: 03/06/2019  Hospital Problem List     Active Problems:   STEMI involving oth coronary artery of inferior wall [facility])    Patient Profile     68 year old male admitted with ST elevation myocardial infarction.  Status post PCI.  Doing well post STEMI.  No chest pain.  Currently on dual antiplatelet therapy with enteric-coated aspirin 81 mg daily and Brilinta 90 mg twice daily.  Would recommend discharge on current regimen.  Follow-up with Dr. Darrold Junker in 1 week.  Subjective   No chest pain or other symptoms.  Right radial cath site without problem.  Inpatient Medications    . aspirin  81 mg Oral Daily  . atorvastatin  80 mg Oral q1800  . carvedilol  3.125 mg Oral BID WC  . lisinopril  5 mg Oral Daily  . sodium chloride flush  3 mL Intravenous Q12H  . ticagrelor  90 mg Oral BID    Vital Signs    Vitals:   03/05/19 2020 03/06/19 0348 03/06/19 0349 03/06/19 0747  BP: (!) 150/81  133/71 114/76  Pulse: 61  (!) 56 91  Resp: 20  20   Temp: 99 F (37.2 C)  98.1 F (36.7 C) 98.3 F (36.8 C)  TempSrc: Oral  Oral Oral  SpO2: 99%  98% 98%  Weight:  120.7 kg    Height:        Intake/Output Summary (Last 24 hours) at 03/06/2019 1010 Last data filed at 03/06/2019 0746 Gross per 24 hour  Intake 680 ml  Output 1000 ml  Net -320 ml   Filed Weights   03/04/19 1447 03/05/19 1509 03/06/19 0348  Weight: 123.9 kg 122.4 kg 120.7 kg    Physical Exam    GEN: Well nourished, well developed, in no acute distress.  HEENT: normal.  Neck: Supple, no JVD, carotid bruits, or masses. Cardiac: RRR, no murmurs, rubs, or gallops. No clubbing, cyanosis, edema.  Radials/DP/PT 2+ and equal bilaterally.  Respiratory:  Respirations regular and unlabored, clear to auscultation bilaterally. GI: Soft, nontender, nondistended, BS + x 4. MS: no deformity or atrophy. Skin: warm and dry, no rash. Neuro:  Strength and sensation are intact. Psych: Normal  affect.  Labs    CBC Recent Labs    03/04/19 1245 03/05/19 0634  WBC 10.8* 13.1*  HGB 16.4 14.8  HCT 50.4 44.7  MCV 92.0 90.5  PLT 219 199   Basic Metabolic Panel Recent Labs    42/35/36 1245 03/05/19 0634  NA 140 140  K 4.0 4.0  CL 104 106  CO2 26 24  GLUCOSE 132* 112*  BUN 16 14  CREATININE 1.11 0.97  CALCIUM 8.9 8.4*   Liver Function Tests No results for input(s): AST, ALT, ALKPHOS, BILITOT, PROT, ALBUMIN in the last 72 hours. No results for input(s): LIPASE, AMYLASE in the last 72 hours. Cardiac Enzymes Recent Labs    03/04/19 1836 03/05/19 0057 03/05/19 0634  TROPONINI 6.37* 16.14* 35.02*   BNP No results for input(s): BNP in the last 72 hours. D-Dimer No results for input(s): DDIMER in the last 72 hours. Hemoglobin A1C Recent Labs    03/05/19 0634  HGBA1C 6.0*   Fasting Lipid Panel Recent Labs    03/05/19 0634  CHOL 175  HDL 31*  LDLCALC 126*  TRIG 90  CHOLHDL 5.6   Thyroid Function Tests No results for input(s): TSH, T4TOTAL, T3FREE, THYROIDAB in the last 72 hours.  Invalid input(s): FREET3  Telemetry    Sinus tachycardia  ECG    Sinus tachycardia  Radiology    Dg Chest Portable 1 View  Result Date: 03/04/2019 CLINICAL DATA:  Chest pain EXAM: PORTABLE CHEST 1 VIEW COMPARISON:  None. FINDINGS: Cardiomegaly. Both lungs are clear. The visualized skeletal structures are unremarkable. IMPRESSION: Cardiomegaly without acute abnormality of the lungs in AP portable projection. Electronically Signed   By: Lauralyn PrimesAlex  Bibbey M.D.   On: 03/04/2019 13:02    Assessment & Plan    68 year old male status post PCI after STEMI.  Currently stable.  Slightly tachycardic but no symptoms.  Would continue with current medications and discharge on current regimen.  Follow-up with Dr. Darrold JunkerParaschos in 1 week.  Signed, Darlin PriestlyKenneth A. Darrien Belter MD 03/06/2019, 10:10 AM  Pager: [phone]

## 2019-03-06 NOTE — Progress Notes (Signed)
Nsg Discharge Note  Admit Date:  03/04/2019 Discharge date: 03/06/2019   Justin Smith to be D/C'd Home per MD order.  AVS completed.  Copy for chart, and copy for patient signed, and dated. Patient/caregiver able to verbalize understanding.  Discharge Medication: Allergies as of 03/06/2019   No Known Allergies     Medication List    TAKE these medications   aspirin 81 MG chewable tablet Chew 1 tablet (81 mg total) by mouth daily for 30 days. Start taking on:  Mar 07, 2019   atorvastatin 80 MG tablet Commonly known as:  LIPITOR Take 1 tablet (80 mg total) by mouth daily at 6 PM for 30 days.   carvedilol 3.125 MG tablet Commonly known as:  COREG Take 1 tablet (3.125 mg total) by mouth 2 (two) times daily with a meal for 30 days.   lisinopril 5 MG tablet Commonly known as:  ZESTRIL Take 1 tablet (5 mg total) by mouth daily for 30 days. Start taking on:  Mar 07, 2019   multivitamin with minerals Tabs tablet Take 1 tablet by mouth daily.   nitroGLYCERIN 0.4 MG SL tablet Commonly known as:  NITROSTAT Place 1 tablet (0.4 mg total) under the tongue every 5 (five) minutes as needed for chest pain.   ticagrelor 90 MG Tabs tablet Commonly known as:  BRILINTA Take 1 tablet (90 mg total) by mouth 2 (two) times daily for 30 days.       Discharge Assessment: Vitals:   03/06/19 0349 03/06/19 0747  BP: 133/71 114/76  Pulse: (!) 56 91  Resp: 20   Temp: 98.1 F (36.7 C) 98.3 F (36.8 C)  SpO2: 98% 98%   Skin clean, dry and intact without evidence of skin break down, no evidence of skin tears noted. IV catheter discontinued intact. Site without signs and symptoms of complications - no redness or edema noted at insertion site, patient denies c/o pain - only slight tenderness at site.  Dressing with slight pressure applied.  D/c Instructions-Education: Discharge instructions given to patient/family with verbalized understanding. D/c education completed with patient/family  including follow up instructions, medication list, d/c activities limitations if indicated, with other d/c instructions as indicated by MD - patient able to verbalize understanding, all questions fully answered. Patient instructed to return to ED, call 911, or call MD for any changes in condition.  Patient escorted via WC, and D/C home via private auto.  Adair Laundry, RN 03/06/2019 11:38 AM

## 2019-03-06 NOTE — Plan of Care (Signed)
  Problem: Clinical Measurements: Goal: Cardiovascular complication will be avoided Outcome: Progressing   Problem: Activity: Goal: Risk for activity intolerance will decrease Outcome: Progressing   Problem: Pain Managment: Goal: General experience of comfort will improve Outcome: Progressing   Problem: Activity: Goal: Ability to tolerate increased activity will improve Outcome: Progressing   Problem: Cardiac: Goal: Ability to achieve and maintain adequate cardiovascular perfusion will improve Outcome: Progressing   

## 2020-05-26 IMAGING — DX PORTABLE CHEST - 1 VIEW
1 series · 1 of 1 positions shown · non-contrast
Comparison: None.

CLINICAL DATA: Chest pain

EXAM:
PORTABLE CHEST 1 VIEW

[chest ap]
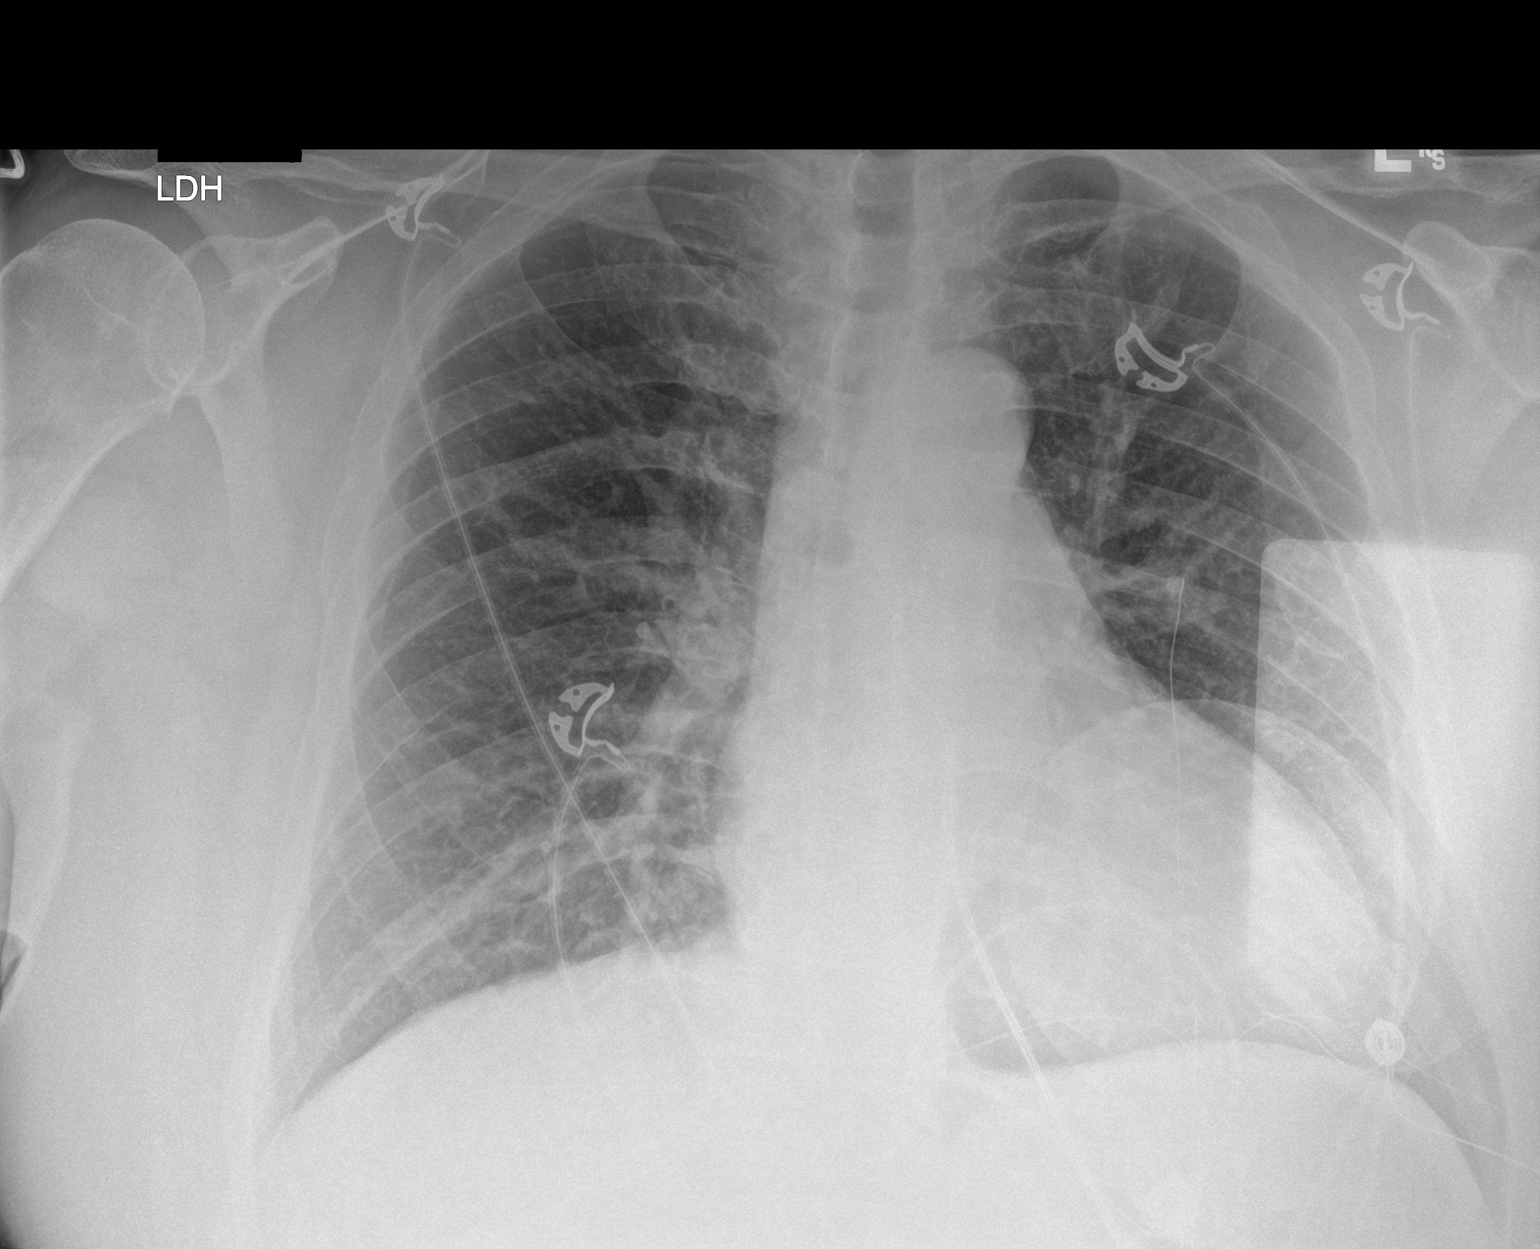

[1 of 1 positions shown; findings below may reference images not displayed]

FINDINGS: Cardiomegaly. Both lungs are clear. The visualized skeletal
structures are unremarkable.
IMPRESSION: Cardiomegaly without acute abnormality of the lungs in AP portable
projection.
